# Patient Record
Sex: Male | Born: 1955 | Hispanic: No | Marital: Married | Smoking: Never smoker
Health system: Southern US, Community
[De-identification: ages and names within clinical notes are randomized; demographics above are authoritative.]

## PROBLEM LIST (undated history)

## (undated) DIAGNOSIS — E781 Pure hyperglyceridemia: Secondary | ICD-10-CM

## (undated) DIAGNOSIS — K219 Gastro-esophageal reflux disease without esophagitis: Secondary | ICD-10-CM

## (undated) DIAGNOSIS — I10 Essential (primary) hypertension: Secondary | ICD-10-CM

## (undated) HISTORY — DX: Essential (primary) hypertension: I10

## (undated) HISTORY — PX: ANKLE SURGERY: SHX546

## (undated) HISTORY — DX: Pure hyperglyceridemia: E78.1

## (undated) HISTORY — DX: Gastro-esophageal reflux disease without esophagitis: K21.9

---

## 2006-03-29 ENCOUNTER — Ambulatory Visit (HOSPITAL_COMMUNITY): Admission: RE | Admit: 2006-03-29 | Discharge: 2006-03-29 | Payer: Self-pay | Admitting: *Deleted

## 2006-03-29 ENCOUNTER — Encounter (INDEPENDENT_AMBULATORY_CARE_PROVIDER_SITE_OTHER): Payer: Self-pay | Admitting: Specialist

## 2007-04-16 ENCOUNTER — Emergency Department (HOSPITAL_COMMUNITY): Admission: EM | Admit: 2007-04-16 | Discharge: 2007-04-16 | Payer: Self-pay | Admitting: Family Medicine

## 2009-07-08 ENCOUNTER — Emergency Department (HOSPITAL_COMMUNITY): Admission: EM | Admit: 2009-07-08 | Discharge: 2009-07-08 | Payer: Self-pay | Admitting: Emergency Medicine

## 2010-04-12 ENCOUNTER — Emergency Department (HOSPITAL_COMMUNITY): Admission: EM | Admit: 2010-04-12 | Discharge: 2010-04-12 | Payer: Self-pay | Admitting: Family Medicine

## 2011-03-14 ENCOUNTER — Ambulatory Visit
Admission: RE | Admit: 2011-03-14 | Discharge: 2011-03-14 | Disposition: A | Discharge status: Home / Self Care | Payer: Managed Care, Other (non HMO) | Source: Ambulatory Visit | Attending: Chiropractic Medicine | Admitting: Chiropractic Medicine

## 2011-03-14 ENCOUNTER — Other Ambulatory Visit: Payer: Self-pay | Admitting: Chiropractic Medicine

## 2011-03-14 DIAGNOSIS — W19XXXA Unspecified fall, initial encounter: Secondary | ICD-10-CM

## 2011-03-16 NOTE — Op Note (Signed)
NAME:  Joel Bird, Joel Bird              ACCOUNT NO.:  [id number]   MEDICAL RECORD NO.:  [id number]          PATIENT TYPE:  AMB   LOCATION:  DAY                          FACILITY:  [facility]   PHYSICIAN:  Alfonse Ras, MD   DATE OF BIRTH:  28-Sep-1956   DATE OF PROCEDURE:  03/29/2006  DATE OF DISCHARGE:                                 OPERATIVE REPORT   PREOPERATIVE DIAGNOSIS:  Internal hemorrhoids and anal skin tag.   POSTOPERATIVE DIAGNOSIS:  Internal hemorrhoids and anal skin tag.   PROCEDURE:  PPH hemorrhoidectomy and rectopexy and excision of anal skin  tags   SURGEON:  Baruch Merl, M.D.   ANESTHESIA:  General.   DESCRIPTION:  The patient was taken to operating room and placed in supine  position.  After adequate anesthesia was induced using endotracheal tube,  the patient was placed in prone jack-knife position.  Perianal and rectal  prep were undertaken.  Internal hemorrhoidal bundles were injected using 0.5  Marcaine and Wydase.  Anal dilatation was accomplished to three  fingerbreadths.  The internal and external sphincter muscles were injected  with additional 0.5 Marcaine.  A 2-0 Prolene pursestring suture was placed  in mucosa and submucosa about 5 cm proximal to the dentate line.  The PPH  stapling device was then placed within the rectum, pursestring suture was  tied down, it was closed, held in place for 45 seconds and fired.  There was  an adequate donut that was inspected and the staple line was inspected and  found not to be bleeding.  There was a separate more epithelial type skin  tag in the left anterior aspect of the perianal skin which was not included  in the stapled hemorrhoidectomy.  For this reason, a Bowie clamp was placed  around it and it was excised and the defect was closed with the mucosa to  muscle to mucosal locking 3-0 chromic suture out to the anoderm.  This was  also injected with 0.5 Marcaine.  Gelfoam packing was placed.  The patient  tolerated  the procedure well and went PACU in good condition.      Alfonse Ras, MD  Electronically Signed     KRE/MEDQ  D:  03/29/2006  T:  03/29/2006  Job:  [id number]

## 2013-12-20 ENCOUNTER — Encounter (HOSPITAL_COMMUNITY): Payer: Self-pay | Admitting: Emergency Medicine

## 2013-12-20 ENCOUNTER — Emergency Department (INDEPENDENT_AMBULATORY_CARE_PROVIDER_SITE_OTHER)
Admission: EM | Admit: 2013-12-20 | Discharge: 2013-12-20 | Disposition: A | Discharge status: Home / Self Care | Payer: BC Managed Care – PPO | Source: Home / Self Care | Attending: Family Medicine | Admitting: Family Medicine

## 2013-12-20 DIAGNOSIS — A088 Other specified intestinal infections: Secondary | ICD-10-CM

## 2013-12-20 DIAGNOSIS — A084 Viral intestinal infection, unspecified: Secondary | ICD-10-CM

## 2013-12-20 NOTE — Discharge Instructions (Signed)
Thank you for coming in today. Continue Imodium per package instructions. Bring back a stool sample is getting worse or not getting better in several days If your belly pain worsens, or you have high fever, bad vomiting, blood in your stool or black tarry stool go to the Emergency Room.   Diet for Diarrhea, Adult Frequent, runny stools (diarrhea) may be caused or worsened by food or drink. Diarrhea may be relieved by changing your diet. Since diarrhea can last up to 7 days, it is easy for you to lose too much fluid from the body and become dehydrated. Fluids that are lost need to be replaced. Along with a modified diet, make sure you drink enough fluids to keep your urine clear or pale yellow. DIET INSTRUCTIONS  Ensure adequate fluid intake (hydration): have 1 cup (8 oz) of fluid for each diarrhea episode. Avoid fluids that contain simple sugars or sports drinks, fruit juices, whole milk products, and sodas. Your urine should be clear or pale yellow if you are drinking enough fluids. Hydrate with an oral rehydration solution that you can purchase at pharmacies, retail stores, and online. You can prepare an oral rehydration solution at home by mixing the following ingredients together:    tsp table salt.   tsp baking soda.   tsp salt substitute containing potassium chloride.  1  tablespoons sugar.  1 L (34 oz) of water.  Certain foods and beverages may increase the speed at which food moves through the gastrointestinal (GI) tract. These foods and beverages should be avoided and include:  Caffeinated and alcoholic beverages.  High-fiber foods, such as raw fruits and vegetables, nuts, seeds, and whole grain breads and cereals.  Foods and beverages sweetened with sugar alcohols, such as xylitol, sorbitol, and mannitol.  Some foods may be well tolerated and may help thicken stool including:  Starchy foods, such as rice, toast, pasta, low-sugar cereal, oatmeal, grits, baked potatoes,  crackers, and bagels.   Bananas.   Applesauce.  Add probiotic-rich foods to help increase healthy bacteria in the GI tract, such as yogurt and fermented milk products. RECOMMENDED FOODS AND BEVERAGES Starches Choose foods with less than 2 g of fiber per serving.  Recommended:  White, [place], and pita breads, plain rolls, buns, bagels. Plain muffins, matzo. Soda, saltine, or graham crackers. Pretzels, melba toast, zwieback. Cooked cereals made with water: cornmeal, farina, cream cereals. Dry cereals: refined corn, wheat, rice. Potatoes prepared any way without skins, refined macaroni, spaghetti, noodles, refined rice.  Avoid:  Bread, rolls, or crackers made with whole wheat, multi-grains, rye, bran seeds, nuts, or coconut. Corn tortillas or taco shells. Cereals containing whole grains, multi-grains, bran, coconut, nuts, raisins. Cooked or dry oatmeal. Coarse wheat cereals, granola. Cereals advertised as "high-fiber." Potato skins. Whole grain pasta, wild or brown rice. Popcorn. Sweet potatoes, yams. Sweet rolls, doughnuts, waffles, pancakes, sweet breads. Vegetables  Recommended: Strained tomato and vegetable juices. Most well-cooked and canned vegetables without seeds. Fresh: Tender lettuce, cucumber without the skin, cabbage, spinach, bean sprouts.  Avoid: Fresh, cooked, or canned: Artichokes, baked beans, beet greens, broccoli, Brussels sprouts, corn, kale, legumes, peas, sweet potatoes. Cooked: Green or red cabbage, spinach. Avoid large servings of any vegetables because vegetables shrink when cooked, and they contain more fiber per serving than fresh vegetables. Fruit  Recommended: Cooked or canned: Apricots, applesauce, cantaloupe, cherries, fruit cocktail, grapefruit, grapes, kiwi, mandarin oranges, peaches, pears, plums, watermelon. Fresh: Apples without skin, ripe banana, grapes, cantaloupe, cherries, grapefruit, peaches, oranges, plums. Keep servings  limited to  cup or 1  piece.  Avoid: Fresh: Apples with skin, apricots, mangoes, pears, raspberries, strawberries. Prune juice, stewed or dried prunes. Dried fruits, raisins, dates. Large servings of all fresh fruits. Protein  Recommended: Ground or well-cooked tender beef, ham, veal, lamb, pork, or poultry. Eggs. Fish, oysters, shrimp, lobster, other seafoods. Liver, organ meats.  Avoid: Tough, fibrous meats with gristle. Peanut butter, smooth or chunky. Cheese, nuts, seeds, legumes, dried peas, beans, lentils. Dairy  Recommended: Yogurt, lactose-free milk, kefir, drinkable yogurt, buttermilk, soy milk, or plain hard cheese.  Avoid: Milk, chocolate milk, beverages made with milk, such as milkshakes. Soups  Recommended: Bouillon, broth, or soups made from allowed foods. Any strained soup.  Avoid: Soups made from vegetables that are not allowed, cream or milk-based soups. Desserts and Sweets  Recommended: Sugar-free gelatin, sugar-free frozen ice pops made without sugar alcohol.  Avoid: Plain cakes and cookies, pie made with fruit, pudding, custard, cream pie. Gelatin, fruit, ice, sherbet, frozen ice pops. Ice cream, ice milk without nuts. Plain hard candy, honey, jelly, molasses, syrup, sugar, chocolate syrup, gumdrops, marshmallows. Fats and Oils  Recommended: Limit fats to less than 8 tsp per day.  Avoid: Seeds, nuts, olives, avocados. Margarine, butter, cream, mayonnaise, salad oils, plain salad dressings. Plain gravy, crisp bacon without rind. Beverages  Recommended: Water, decaffeinated teas, oral rehydration solutions, sugar-free beverages not sweetened with sugar alcohols.  Avoid: Fruit juices, caffeinated beverages (coffee, tea, soda), alcohol, sports drinks, or lemon-lime soda. Condiments  Recommended: Ketchup, mustard, horseradish, vinegar, cocoa powder. Spices in moderation: allspice, basil, bay leaves, celery powder or leaves, cinnamon, cumin powder, curry powder, ginger, mace, marjoram,  onion or garlic powder, oregano, paprika, parsley flakes, ground pepper, rosemary, sage, savory, tarragon, thyme, turmeric.  Avoid: Coconut, honey. Document Released: 01/05/2004 Document Revised: 07/09/2012 Document Reviewed: 02/29/2012 [facility] Patient Information 2014 [place].

## 2013-12-20 NOTE — ED Provider Notes (Signed)
Joel Bird is a 58 y.o. male who presents to Urgent Care today for diarrhea. Patient is in 3 days of diarrhea. He notes some abdominal cramping but denies any severe abdominal pain. He denies any vomiting or blood in his stool. He took Imodium 2 days ago which seemed to help temporarily. He is not repeated. He denies any fevers or chills or trouble breathing. He is eating and drinking. He feels well otherwise. He works as a Administrator.   No past medical history on file. History  Substance Use Topics  . Smoking status: Not on file  . Smokeless tobacco: Not on file  . Alcohol Use: Not on file   ROS as above Medications: No current facility-administered medications for this encounter.   No current outpatient prescriptions on file.    Exam:  BP 117/76  Pulse 72  Temp(Src) 98.2 F (36.8 C) (Oral)  Resp 18  SpO2 100% Gen: Well NAD HEENT: EOMI,  MMM Lungs: Normal work of breathing. CTABL Heart: RRR no MRG Abd: NABS, Soft. NT, ND no rebound or guarding Exts: Brisk capillary refill, warm and well perfused.    Assessment and Plan: 58 y.o. male with likely viral diarrhea. Unlikely for serious bacterial infection such as Salmonella or Escherichia coli.  I will send the patient home with equipment needed for stool culture. He will return with stool culture if not getting better or worsening. In the meantime we'll treat his symptoms with Imodium.  Work note provided.  Discussed warning signs or symptoms. Please see discharge instructions. Patient expresses understanding.    Gregor Hams, MD 12/20/13 1020

## 2013-12-20 NOTE — ED Notes (Signed)
C/o abdominal cramping with diarrhea.  Low grade temp. Nausea.  X 3 days.  Mild relief with otc meds.  Denies any other symptoms

## 2015-07-10 ENCOUNTER — Encounter (HOSPITAL_COMMUNITY): Payer: Self-pay | Admitting: Emergency Medicine

## 2015-07-10 ENCOUNTER — Emergency Department (HOSPITAL_COMMUNITY)
Admission: EM | Admit: 2015-07-10 | Discharge: 2015-07-10 | Disposition: A | Discharge status: Home / Self Care | Payer: BLUE CROSS/BLUE SHIELD | Source: Home / Self Care | Attending: Family Medicine | Admitting: Family Medicine

## 2015-07-10 DIAGNOSIS — J069 Acute upper respiratory infection, unspecified: Secondary | ICD-10-CM

## 2015-07-10 DIAGNOSIS — J0101 Acute recurrent maxillary sinusitis: Secondary | ICD-10-CM | POA: Diagnosis not present

## 2015-07-10 MED ORDER — AZITHROMYCIN 250 MG PO TABS: 250.0000 mg | ORAL_TABLET | Freq: Every day | ORAL | Status: DC

## 2015-07-10 MED ORDER — AZITHROMYCIN 250 MG PO TABS: ORAL_TABLET | ORAL | Status: DC

## 2015-07-10 MED ORDER — PREDNISONE 20 MG PO TABS: ORAL_TABLET | ORAL | Status: DC

## 2015-07-10 NOTE — ED Notes (Signed)
COUGH, PRODUCTIVE COUGH, COUGHING UP GREEN PHLEGM, FEVER, SYMPTOMS SINCE Thursday.

## 2015-07-10 NOTE — ED Provider Notes (Signed)
CSN: [id number]     Arrival date & time 07/10/15  1818 History   First MD Initiated Contact with Patient 07/10/15 1904     No chief complaint on file.  (Consider location/radiation/quality/duration/timing/severity/associated sxs/prior Treatment) The history is provided by the patient.   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 It started late Thursday night and early Friday morning. He currently gets this every year but this year it's come on earlier than usual.  He has been taking Mucinex but this has not helped in fact is getting worse.      No past medical history on file. No past surgical history on file. No family history on file. Social History  Substance Use Topics  . Smoking status: Never Smoker   . Smokeless tobacco: Not on file  . Alcohol Use: Yes    Review of Systems  Constitutional: Positive for fever, chills and fatigue.  HENT: Positive for congestion and sinus pressure.   Eyes: Negative.   Respiratory: Positive for cough.   Cardiovascular: Negative.   Genitourinary: Negative.     Allergies  Review of patient's allergies indicates no known allergies.  Home Medications   Prior to Admission medications   Not on File   Meds Ordered and Administered this Visit  Medications - No data to display  BP 126/74 mmHg  Pulse 98  Temp(Src) 99 F (37.2 C) (Oral)  Resp 18  SpO2 96% No data found.   Physical Exam  Constitutional: He is oriented to person, place, and time. He appears well-developed and well-nourished.  HENT:  Head: Normocephalic and atraumatic.  Right Ear: External ear normal.  Left Ear: External ear normal.  Mouth/Throat: Oropharynx is clear and moist. No oropharyngeal exudate.  Eyes: Conjunctivae and EOM are normal. Pupils are equal, round, and reactive to light.  Neck: Normal range of motion. Neck supple. No thyromegaly present.  Cardiovascular: Normal rate and  regular rhythm.   Pulmonary/Chest: Effort normal and breath sounds normal. No respiratory distress.  Musculoskeletal: Normal range of motion.  Lymphadenopathy:    He has no cervical adenopathy.  Neurological: He is alert and oriented to person, place, and time.  Skin: Skin is warm and dry.  Nursing note and vitals reviewed.   ED Course  Procedures (including critical care time)    MDM  This is a 59 year old gentleman with worsening upper respiratory symptoms  Plan: Z-Pak and prednisone  Signed, Robyn Haber M.D.    Robyn Haber, MD 07/10/15 [phone]

## 2016-06-15 ENCOUNTER — Ambulatory Visit: Payer: BLUE CROSS/BLUE SHIELD | Admitting: Podiatry

## 2016-06-21 ENCOUNTER — Ambulatory Visit (INDEPENDENT_AMBULATORY_CARE_PROVIDER_SITE_OTHER): Payer: BLUE CROSS/BLUE SHIELD

## 2016-06-21 ENCOUNTER — Ambulatory Visit (INDEPENDENT_AMBULATORY_CARE_PROVIDER_SITE_OTHER): Payer: BLUE CROSS/BLUE SHIELD | Admitting: Podiatry

## 2016-06-21 ENCOUNTER — Encounter: Payer: Self-pay | Admitting: Podiatry

## 2016-06-21 VITALS — BP 140/84 | HR 69 | Resp 14 | Ht 72.0 in | Wt 193.0 lb

## 2016-06-21 DIAGNOSIS — M778 Other enthesopathies, not elsewhere classified: Secondary | ICD-10-CM

## 2016-06-21 DIAGNOSIS — M79671 Pain in right foot: Secondary | ICD-10-CM

## 2016-06-21 DIAGNOSIS — M779 Enthesopathy, unspecified: Secondary | ICD-10-CM

## 2016-06-21 DIAGNOSIS — M2140 Flat foot [pes planus] (acquired), unspecified foot: Secondary | ICD-10-CM

## 2016-06-21 MED ORDER — TRIAMCINOLONE ACETONIDE 10 MG/ML IJ SUSP
10.0000 mg | Freq: Once | INTRAMUSCULAR | Status: AC
  Administered 2016-06-21: 10 mg

## 2016-06-21 NOTE — Progress Notes (Signed)
   Subjective:    Patient ID: Joel Bird, male    DOB: [birth year], 60 y.o.   MRN: [id number]  HPI  I have been having pain in the outside of my right foot for years but seems to be getting worse over the last 2-3 months,  Ice helps a little    Review of Systems  All other systems reviewed and are negative.      Objective:   Physical Exam        Assessment & Plan:

## 2016-06-21 NOTE — Progress Notes (Signed)
Subjective:     Patient ID: Joel Bird, male   DOB: 11/17/1955, 60 y.o.   MRN: [id number]  HPI patient presents with a lot of pain in the outside of the right foot and also has severe flatfoot deformity that makes ambulation difficult. States it's been hurting on the outside of the foot for 2-3 months   Review of Systems  All other systems reviewed and are negative.      Objective:   Physical Exam  Constitutional: He is oriented to person, place, and time.  Cardiovascular: Intact distal pulses.   Musculoskeletal: Normal range of motion.  Neurological: He is oriented to person, place, and time.  Skin: Skin is warm.  Nursing note and vitals reviewed.  neurovascular status was found to be intact with patient found to have inflammatory changes around the peroneal insertion right with no indication of tendon dysfunction or muscle strength loss and is noted to have significant depression of the arch medial bilateral with prominence of the navicular bilateral. Patient's found to have good digital perfusion and is well oriented 3     Assessment:     Peroneal tendinitis right with inflammation and moderate flatfoot deformity bilateral    Plan:     H&P conditions reviewed and discussed long-term orthotics but first I want to treat acute condition. Injected the sheath of peroneal tendon 3 mg Kenalog 5 mg Xylocaine and placed in a fascial brace for medial lateral and instructed on heat ice therapy. Reappoint to recheck again in the next several weeks  X-ray report indicated that there is depression of the arch bilateral with no indications of peroneal or base of fifth injury right

## 2016-07-23 ENCOUNTER — Ambulatory Visit (INDEPENDENT_AMBULATORY_CARE_PROVIDER_SITE_OTHER): Payer: BLUE CROSS/BLUE SHIELD | Admitting: Podiatry

## 2016-07-23 DIAGNOSIS — M779 Enthesopathy, unspecified: Secondary | ICD-10-CM

## 2016-07-23 DIAGNOSIS — M79671 Pain in right foot: Secondary | ICD-10-CM

## 2016-07-23 DIAGNOSIS — M7671 Peroneal tendinitis, right leg: Secondary | ICD-10-CM

## 2016-07-23 DIAGNOSIS — M778 Other enthesopathies, not elsewhere classified: Secondary | ICD-10-CM

## 2016-07-23 MED ORDER — NABUMETONE 500 MG PO TABS: 750.0000 mg | ORAL_TABLET | Freq: Two times a day (BID) | ORAL | Status: AC

## 2016-07-23 MED ORDER — BETAMETHASONE SOD PHOS & ACET 6 (3-3) MG/ML IJ SUSP: 12.0000 mg | Freq: Once | INTRAMUSCULAR | Status: AC

## 2016-07-23 MED ORDER — NONFORMULARY OR COMPOUNDED ITEM: 1.0000 g | Freq: Four times a day (QID) | 2 refills | Status: AC

## 2016-07-23 NOTE — Progress Notes (Signed)
Subjective: Patient presents today follow-up evaluation of a peroneal tendinitis to the right at the insertion of the fifth metatarsal. Patient states that he was doing better however yesterday he is walking dollies had to do a limited jogging any reinitiated the injury. Patient presents today with pain and tenderness to the right lateral foot.   Objective: Physical Exam General: The patient is alert and oriented x3 in no acute distress.  Dermatology: Skin is warm, dry and supple bilateral lower extremities. Negative for open lesions or macerations.  Vascular: Palpable pedal pulses bilaterally. No edema or erythema noted. Capillary refill within normal limits.  Neurological: Epicritic and protective threshold grossly intact bilaterally.   Musculoskeletal Exam: Moderate pain on palpation to the insertion of the peroneal tendon into the base the fifth metatarsal right foot.  Range of motion within normal limits to all pedal and ankle joints bilateral. Muscle strength 5/5 in all groups bilateral.    Assessment: #1 Peroneal tendon enthesopathy right foot #2 pain in right foot  Problem List Items Addressed This Visit    None    Visit Diagnoses   None.     Plan of Care:  #1 Patient was evaluated. #2 Injection of 0.5 mL Celestone Soluspan injected in the patient's right foot at the insertion of the peroneal brevis into the base of the fifth metatarsal. #3 patient does not want immobilization at this time, he is ticklish to continue plantar fascial brace #4 prescription for Motrin dispensed today as well as a prescription for anti-inflammatory pain cream through [place] #5 patient is to return to clinic in 4 weeks     Dr. Edrick Kins, [place]

## 2017-04-16 DIAGNOSIS — E785 Hyperlipidemia, unspecified: Secondary | ICD-10-CM | POA: Diagnosis not present

## 2019-01-26 DIAGNOSIS — Z0001 Encounter for general adult medical examination with abnormal findings: Secondary | ICD-10-CM | POA: Diagnosis not present

## 2019-01-26 DIAGNOSIS — Z125 Encounter for screening for malignant neoplasm of prostate: Secondary | ICD-10-CM | POA: Diagnosis not present

## 2019-01-26 DIAGNOSIS — E785 Hyperlipidemia, unspecified: Secondary | ICD-10-CM | POA: Diagnosis not present

## 2019-01-26 DIAGNOSIS — K2970 Gastritis, unspecified, without bleeding: Secondary | ICD-10-CM | POA: Diagnosis not present

## 2019-02-24 DIAGNOSIS — K2900 Acute gastritis without bleeding: Secondary | ICD-10-CM | POA: Diagnosis not present

## 2019-05-23 DIAGNOSIS — K2970 Gastritis, unspecified, without bleeding: Secondary | ICD-10-CM | POA: Diagnosis not present

## 2019-08-11 MED FILL — SUCRALFATE 1 GM TABLET: 1 | 22 days supply | Qty: 90 | Fill #0

## 2019-10-30 DIAGNOSIS — Z20828 Contact with and (suspected) exposure to other viral communicable diseases: Secondary | ICD-10-CM | POA: Diagnosis not present

## 2019-12-23 MED FILL — FENOFIBRATE 48 MG TABLET: 48 | 90 days supply | Qty: 90 | Fill #0

## 2020-07-06 ENCOUNTER — Emergency Department: Admit: 2020-07-06 | Discharge status: Against Medical Advice | Payer: Self-pay

## 2021-01-23 ENCOUNTER — Other Ambulatory Visit (HOSPITAL_COMMUNITY): Payer: Self-pay | Admitting: Family Medicine

## 2021-01-23 DIAGNOSIS — R030 Elevated blood-pressure reading, without diagnosis of hypertension: Secondary | ICD-10-CM | POA: Diagnosis not present

## 2021-01-23 DIAGNOSIS — E781 Pure hyperglyceridemia: Secondary | ICD-10-CM | POA: Diagnosis not present

## 2021-01-23 DIAGNOSIS — R143 Flatulence: Secondary | ICD-10-CM | POA: Diagnosis not present

## 2021-01-23 DIAGNOSIS — E785 Hyperlipidemia, unspecified: Secondary | ICD-10-CM | POA: Diagnosis not present

## 2021-01-27 ENCOUNTER — Other Ambulatory Visit (HOSPITAL_COMMUNITY): Payer: Self-pay | Admitting: Family Medicine

## 2021-01-30 ENCOUNTER — Other Ambulatory Visit: Payer: Self-pay | Admitting: Family Medicine

## 2021-01-30 DIAGNOSIS — R143 Flatulence: Secondary | ICD-10-CM

## 2021-02-15 ENCOUNTER — Other Ambulatory Visit: Payer: Self-pay

## 2021-02-15 ENCOUNTER — Other Ambulatory Visit (HOSPITAL_COMMUNITY): Payer: Self-pay

## 2021-02-15 MED FILL — Fenofibrate Tab 48 MG: ORAL | 90 days supply | Qty: 90 | Fill #0 | Status: CN

## 2021-02-15 MED FILL — Fenofibrate Tab 48 MG: ORAL | 90 days supply | Qty: 90 | Fill #0 | Status: AC

## 2021-02-16 ENCOUNTER — Ambulatory Visit
Admission: RE | Admit: 2021-02-16 | Discharge: 2021-02-16 | Disposition: A | Discharge status: Home / Self Care | Payer: BLUE CROSS/BLUE SHIELD | Source: Ambulatory Visit | Attending: Family Medicine | Admitting: Family Medicine

## 2021-02-16 DIAGNOSIS — R143 Flatulence: Secondary | ICD-10-CM

## 2021-02-16 DIAGNOSIS — N281 Cyst of kidney, acquired: Secondary | ICD-10-CM | POA: Diagnosis not present

## 2021-02-16 DIAGNOSIS — K760 Fatty (change of) liver, not elsewhere classified: Secondary | ICD-10-CM | POA: Diagnosis not present

## 2021-02-25 DIAGNOSIS — R143 Flatulence: Secondary | ICD-10-CM | POA: Diagnosis not present

## 2021-03-13 ENCOUNTER — Other Ambulatory Visit: Payer: Self-pay

## 2021-03-14 ENCOUNTER — Other Ambulatory Visit: Payer: Self-pay

## 2021-03-15 ENCOUNTER — Other Ambulatory Visit: Payer: Self-pay

## 2021-03-16 ENCOUNTER — Other Ambulatory Visit: Payer: Self-pay

## 2021-03-17 ENCOUNTER — Other Ambulatory Visit: Payer: Self-pay

## 2021-03-20 ENCOUNTER — Other Ambulatory Visit: Payer: Self-pay

## 2021-03-21 ENCOUNTER — Other Ambulatory Visit (HOSPITAL_COMMUNITY): Payer: Self-pay

## 2021-03-21 ENCOUNTER — Other Ambulatory Visit: Payer: Self-pay

## 2021-03-21 MED ORDER — SUCRALFATE 1 G PO TABS
1.0000 g | ORAL_TABLET | Freq: Three times a day (TID) | ORAL | 0 refills | Status: AC
  Filled 2021-03-21 (×3): qty 90, 23d supply, fill #0

## 2021-03-22 ENCOUNTER — Other Ambulatory Visit: Payer: Self-pay

## 2021-04-10 ENCOUNTER — Encounter: Payer: Self-pay | Admitting: Gastroenterology

## 2021-05-02 ENCOUNTER — Ambulatory Visit: Payer: 59 | Admitting: Gastroenterology

## 2021-05-18 ENCOUNTER — Ambulatory Visit: Payer: 59 | Admitting: Internal Medicine

## 2021-05-25 ENCOUNTER — Ambulatory Visit: Payer: 59 | Admitting: Gastroenterology

## 2021-07-04 ENCOUNTER — Other Ambulatory Visit (HOSPITAL_COMMUNITY): Payer: Self-pay

## 2021-07-05 ENCOUNTER — Other Ambulatory Visit (HOSPITAL_COMMUNITY): Payer: Self-pay

## 2021-12-29 DIAGNOSIS — R195 Other fecal abnormalities: Secondary | ICD-10-CM | POA: Diagnosis not present

## 2022-01-16 DIAGNOSIS — K219 Gastro-esophageal reflux disease without esophagitis: Secondary | ICD-10-CM | POA: Diagnosis not present

## 2022-01-16 DIAGNOSIS — K5730 Diverticulosis of large intestine without perforation or abscess without bleeding: Secondary | ICD-10-CM | POA: Diagnosis not present

## 2022-01-16 DIAGNOSIS — Z86010 Personal history of colonic polyps: Secondary | ICD-10-CM | POA: Diagnosis not present

## 2022-01-16 DIAGNOSIS — K625 Hemorrhage of anus and rectum: Secondary | ICD-10-CM | POA: Diagnosis not present

## 2022-01-16 DIAGNOSIS — Z1211 Encounter for screening for malignant neoplasm of colon: Secondary | ICD-10-CM | POA: Diagnosis not present

## 2022-02-05 IMAGING — US US ABDOMEN LIMITED RUQ/ASCITES
1 series · 14 of 25 positions shown · non-contrast
Comparison: None.

CLINICAL DATA: 65-year-old male with flatulence.

EXAM:
ULTRASOUND ABDOMEN LIMITED RIGHT UPPER QUADRANT

[Series 1: us abdomen limited ruq/ascites · 0.20mm/px · 14 of 56 slices shown]
[im 1/56]
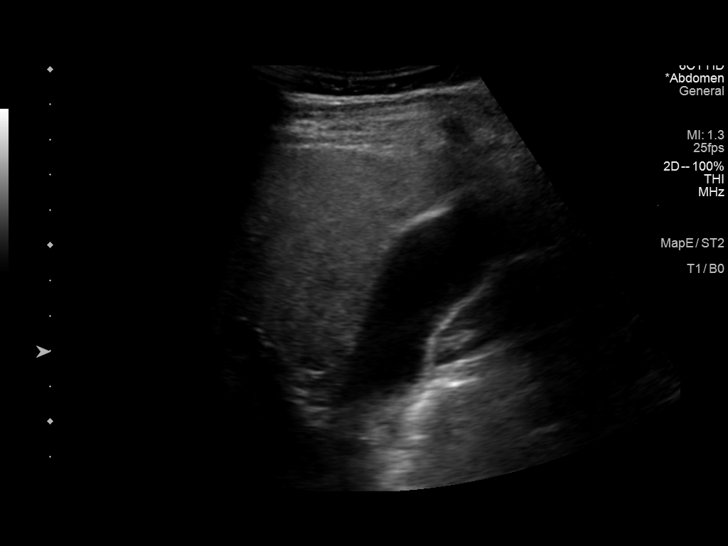
[im 5/56]
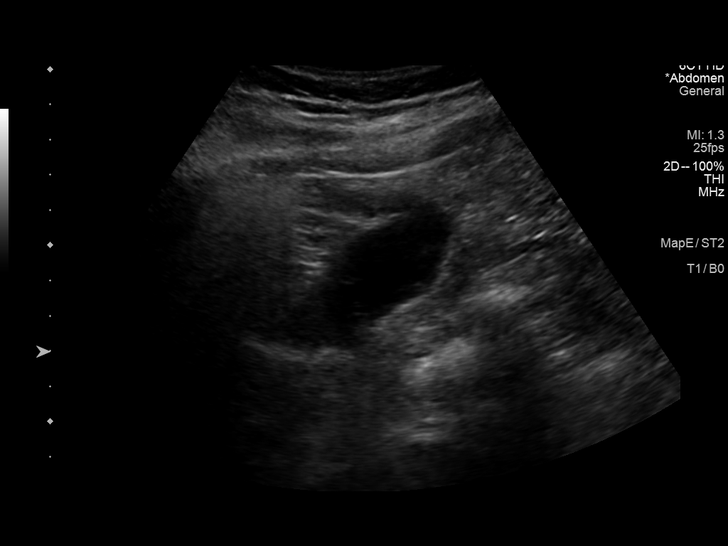
[im 10/56]
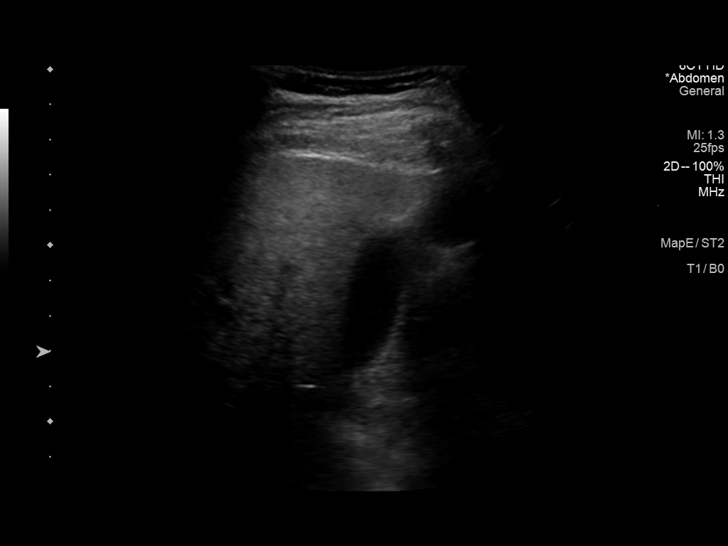
[im 14/56]
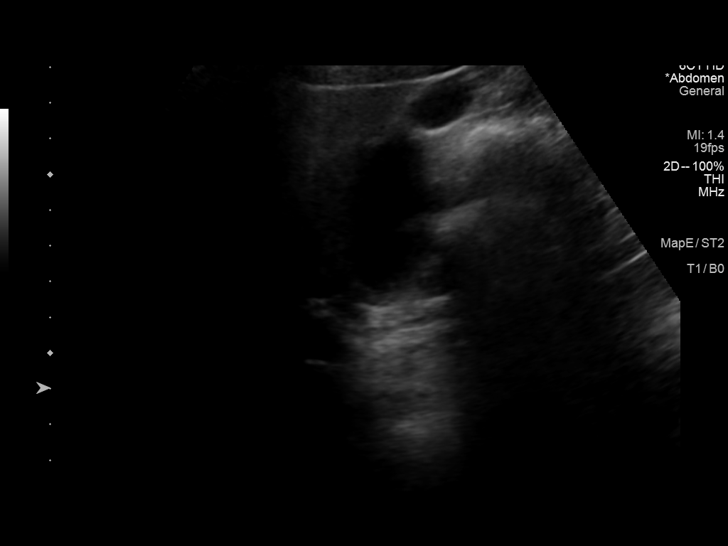
[im 19/56]
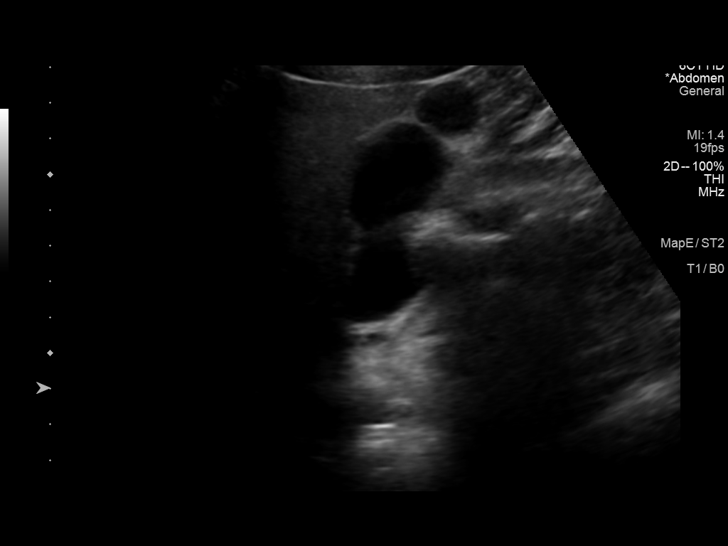
[im 21/56]
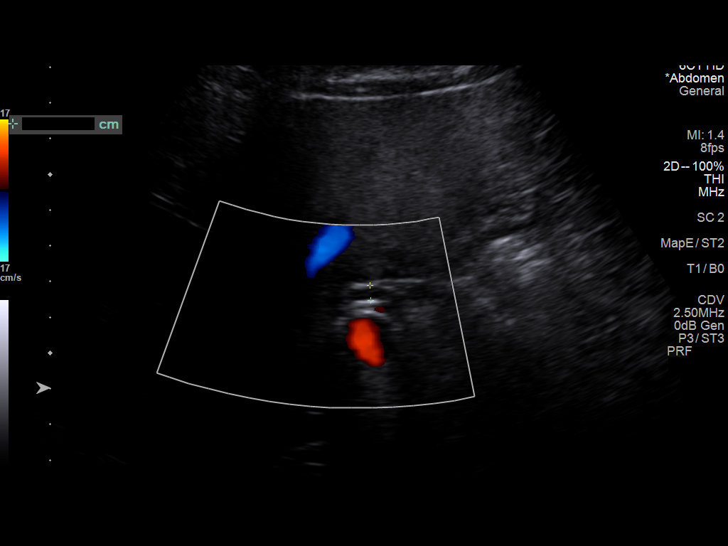
[im 26/56]
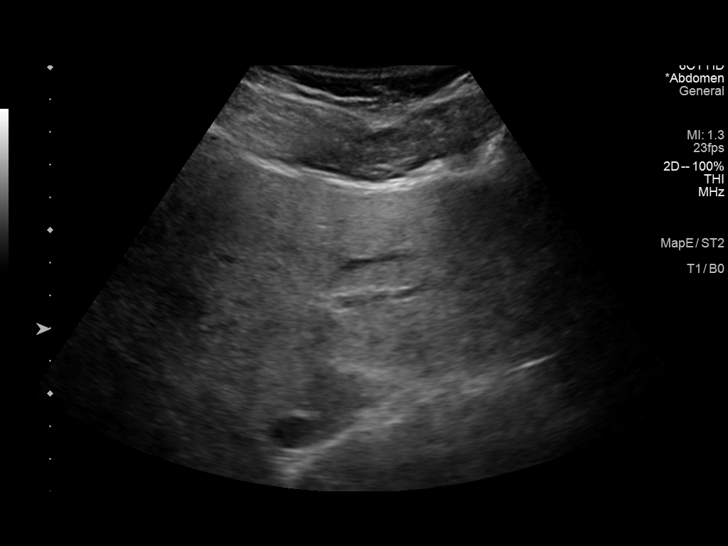
[im 30/56]
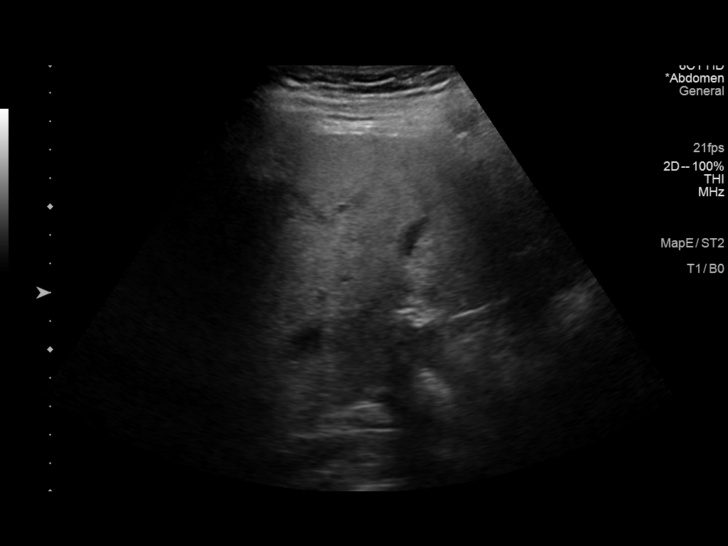
[im 35/56]
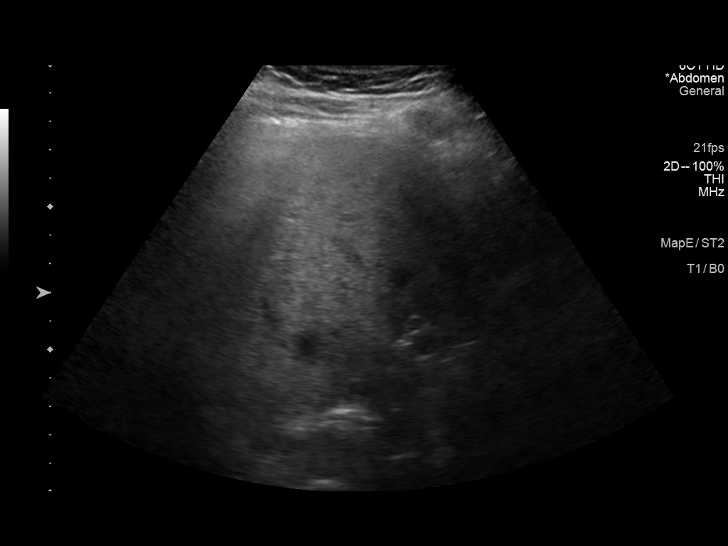
[im 37/56]
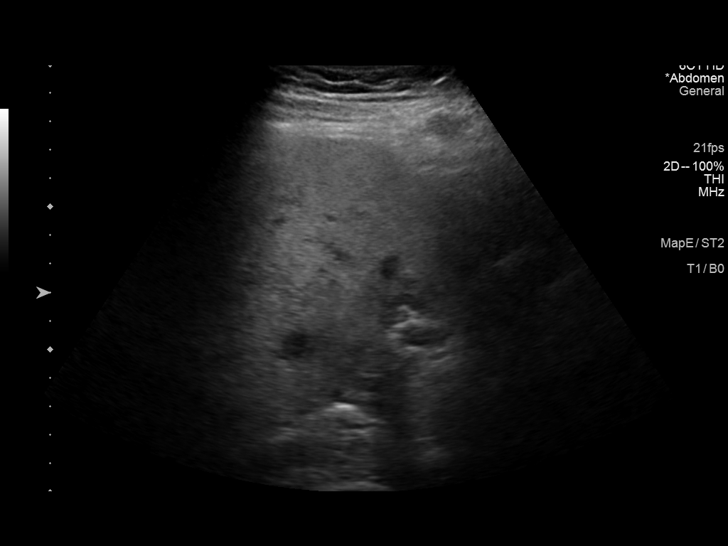
[im 42/56]
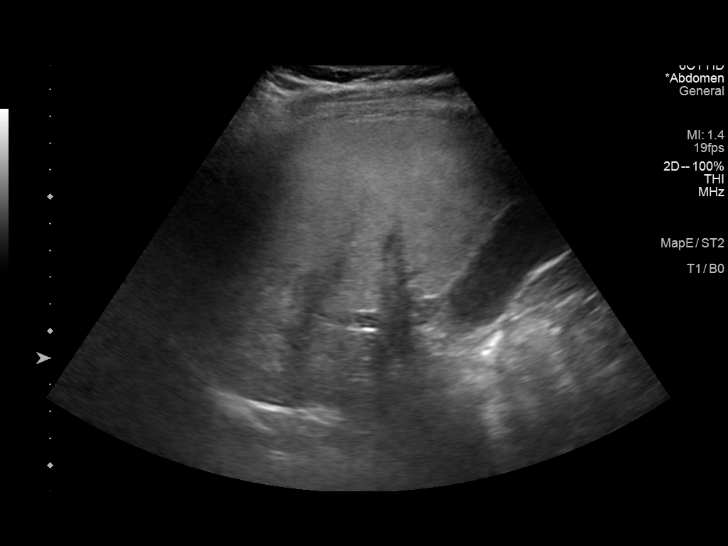
[im 46/56]
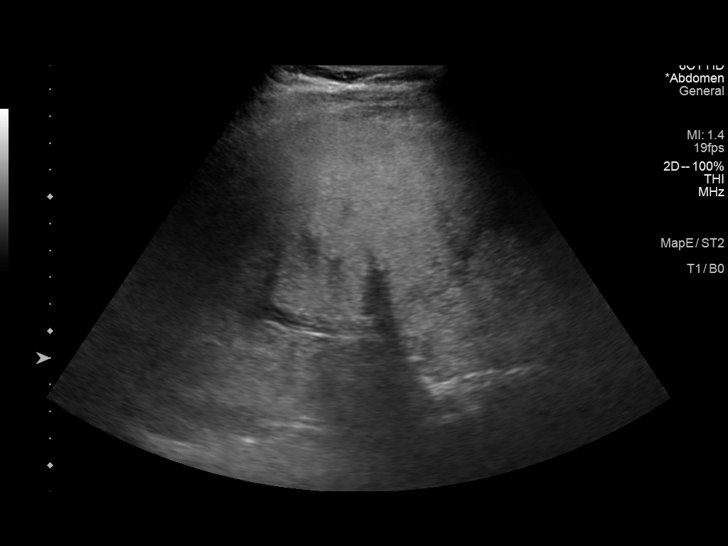
[im 51/56]
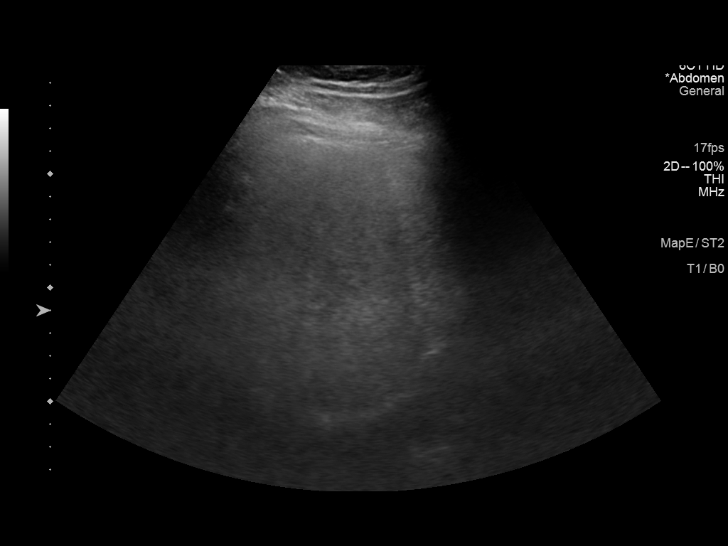
[im 56/56]
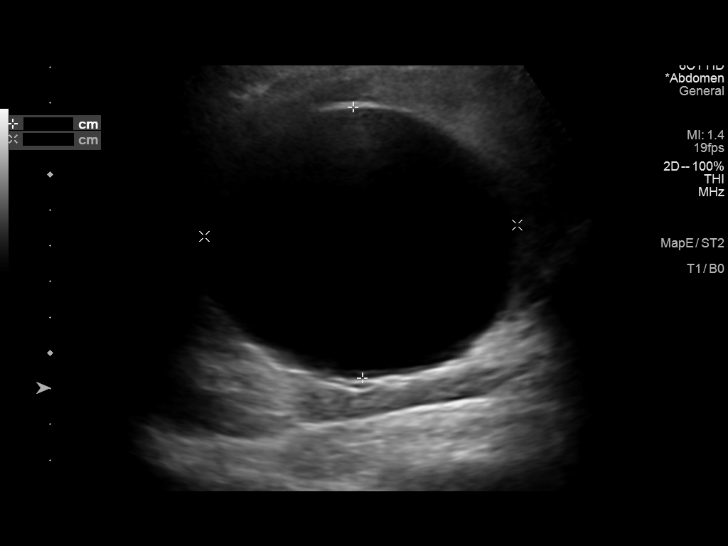

[14 of 25 positions shown; findings below may reference images not displayed]

FINDINGS: Gallbladder:

No gallstones or wall thickening visualized. No sonographic Murphy
sign noted by sonographer.

Common bile duct:

Diameter: 4 mm

Liver:

There is diffuse increased liver echogenicity most commonly seen in
the setting of fatty infiltration. Superimposed inflammation or
fibrosis is not excluded. Clinical correlation is recommended.
Portal vein is patent on color Doppler imaging with normal direction
of blood flow towards the liver.

Other: Partially visualized 9 cm cyst from the upper pole of the
right kidney.
IMPRESSION: 1. Fatty liver, otherwise unremarkable right upper quadrant
ultrasound.
2. Partially visualized right renal upper pole cyst.

## 2022-02-21 ENCOUNTER — Other Ambulatory Visit (HOSPITAL_COMMUNITY): Payer: Self-pay

## 2022-02-21 MED ORDER — CLENPIQ 10-3.5-12 MG-GM -GM/175ML PO SOLN
175.0000 mL | Freq: Two times a day (BID) | ORAL | 0 refills | Status: AC
  Filled 2022-02-21: qty 350, 1d supply, fill #0

## 2022-02-22 ENCOUNTER — Other Ambulatory Visit (HOSPITAL_COMMUNITY): Payer: Self-pay

## 2022-02-23 ENCOUNTER — Other Ambulatory Visit (HOSPITAL_COMMUNITY): Payer: Self-pay

## 2022-03-05 DIAGNOSIS — K635 Polyp of colon: Secondary | ICD-10-CM | POA: Diagnosis not present

## 2022-03-05 DIAGNOSIS — K5730 Diverticulosis of large intestine without perforation or abscess without bleeding: Secondary | ICD-10-CM | POA: Diagnosis not present

## 2022-03-05 DIAGNOSIS — D124 Benign neoplasm of descending colon: Secondary | ICD-10-CM | POA: Diagnosis not present

## 2022-03-05 DIAGNOSIS — Z1211 Encounter for screening for malignant neoplasm of colon: Secondary | ICD-10-CM | POA: Diagnosis not present

## 2022-03-05 DIAGNOSIS — Z86010 Personal history of colonic polyps: Secondary | ICD-10-CM | POA: Diagnosis not present

## 2022-11-05 DIAGNOSIS — E785 Hyperlipidemia, unspecified: Secondary | ICD-10-CM | POA: Diagnosis not present

## 2022-11-05 DIAGNOSIS — Z7689 Persons encountering health services in other specified circumstances: Secondary | ICD-10-CM | POA: Diagnosis not present

## 2022-11-05 DIAGNOSIS — Z87891 Personal history of nicotine dependence: Secondary | ICD-10-CM | POA: Diagnosis not present

## 2022-11-05 DIAGNOSIS — Z136 Encounter for screening for cardiovascular disorders: Secondary | ICD-10-CM | POA: Diagnosis not present

## 2022-11-05 DIAGNOSIS — G44229 Chronic tension-type headache, not intractable: Secondary | ICD-10-CM | POA: Diagnosis not present

## 2022-11-05 DIAGNOSIS — R3914 Feeling of incomplete bladder emptying: Secondary | ICD-10-CM | POA: Diagnosis not present

## 2022-11-05 DIAGNOSIS — K219 Gastro-esophageal reflux disease without esophagitis: Secondary | ICD-10-CM | POA: Diagnosis not present

## 2022-11-15 DIAGNOSIS — Z87891 Personal history of nicotine dependence: Secondary | ICD-10-CM | POA: Diagnosis not present

## 2022-11-15 DIAGNOSIS — Z136 Encounter for screening for cardiovascular disorders: Secondary | ICD-10-CM | POA: Diagnosis not present

## 2023-01-19 DIAGNOSIS — H40003 Preglaucoma, unspecified, bilateral: Secondary | ICD-10-CM | POA: Diagnosis not present

## 2023-01-30 DIAGNOSIS — I10 Essential (primary) hypertension: Secondary | ICD-10-CM | POA: Diagnosis not present

## 2023-01-30 DIAGNOSIS — G245 Blepharospasm: Secondary | ICD-10-CM | POA: Diagnosis not present

## 2023-01-30 DIAGNOSIS — R002 Palpitations: Secondary | ICD-10-CM | POA: Diagnosis not present

## 2023-01-30 DIAGNOSIS — R0981 Nasal congestion: Secondary | ICD-10-CM | POA: Diagnosis not present

## 2023-02-20 DIAGNOSIS — R002 Palpitations: Secondary | ICD-10-CM | POA: Diagnosis not present

## 2023-02-20 DIAGNOSIS — I10 Essential (primary) hypertension: Secondary | ICD-10-CM | POA: Diagnosis not present

## 2023-03-29 DIAGNOSIS — G514 Facial myokymia: Secondary | ICD-10-CM | POA: Diagnosis not present

## 2023-03-29 DIAGNOSIS — H40039 Anatomical narrow angle, unspecified eye: Secondary | ICD-10-CM | POA: Diagnosis not present

## 2023-06-17 DIAGNOSIS — G514 Facial myokymia: Secondary | ICD-10-CM | POA: Diagnosis not present

## 2024-02-24 DIAGNOSIS — R591 Generalized enlarged lymph nodes: Secondary | ICD-10-CM | POA: Diagnosis not present

## 2024-03-26 DIAGNOSIS — R002 Palpitations: Secondary | ICD-10-CM | POA: Diagnosis not present

## 2024-03-26 DIAGNOSIS — G44229 Chronic tension-type headache, not intractable: Secondary | ICD-10-CM | POA: Diagnosis not present

## 2024-03-26 DIAGNOSIS — R3914 Feeling of incomplete bladder emptying: Secondary | ICD-10-CM | POA: Diagnosis not present

## 2024-03-26 DIAGNOSIS — K219 Gastro-esophageal reflux disease without esophagitis: Secondary | ICD-10-CM | POA: Diagnosis not present

## 2024-03-26 DIAGNOSIS — Z0000 Encounter for general adult medical examination without abnormal findings: Secondary | ICD-10-CM | POA: Diagnosis not present

## 2024-03-26 DIAGNOSIS — E785 Hyperlipidemia, unspecified: Secondary | ICD-10-CM | POA: Diagnosis not present

## 2024-05-15 DIAGNOSIS — N1831 Chronic kidney disease, stage 3a: Secondary | ICD-10-CM | POA: Diagnosis not present
